# Patient Record
Sex: Female | Born: 1955 | Race: White | Hispanic: No | Marital: Married | Smoking: Never smoker
Health system: Southern US, Community
[De-identification: ages and names within clinical notes are randomized; demographics above are authoritative.]

## PROBLEM LIST (undated history)

## (undated) HISTORY — PX: EYE SURGERY: SHX253

## (undated) HISTORY — PX: CATARACT EXTRACTION: SUR2

---

## 2016-02-19 ENCOUNTER — Emergency Department (INDEPENDENT_AMBULATORY_CARE_PROVIDER_SITE_OTHER): Payer: BLUE CROSS/BLUE SHIELD

## 2016-02-19 ENCOUNTER — Emergency Department
Admission: EM | Admit: 2016-02-19 | Discharge: 2016-02-19 | Disposition: A | Discharge status: Home / Self Care | Payer: BLUE CROSS/BLUE SHIELD | Source: Home / Self Care | Attending: Family Medicine | Admitting: Family Medicine

## 2016-02-19 ENCOUNTER — Encounter: Payer: Self-pay | Admitting: *Deleted

## 2016-02-19 DIAGNOSIS — M7672 Peroneal tendinitis, left leg: Secondary | ICD-10-CM

## 2016-02-19 DIAGNOSIS — M25472 Effusion, left ankle: Secondary | ICD-10-CM

## 2016-02-19 MED ORDER — MELOXICAM 15 MG PO TABS: 15.0000 mg | ORAL_TABLET | Freq: Every day | ORAL | Status: DC

## 2016-02-19 NOTE — Discharge Instructions (Signed)
Apply ice pack for 20 to 30 minutes, 3 to 4 times daily for 2 to 3 days.  Elevate.  Wear brace for about 2 to 3 weeks.  Begin range of motion and stretching exercises in about 5 days as per instruction sheet.   Peroneal Tendinitis With Rehab Tendonitis is inflammation of a tendon. Inflammation of the tendons on the back of the outer ankle (peroneal tendons) is known as peroneal tendonitis. The peroneal tendons are responsible for connecting the muscles that allow you to stand on your tiptoes to the bones of the ankle. For this reason, peroneal tendonitis often causes pain when trying to complete such motions. Peroneal tendonitis often involves a tear (strain) of the peroneal tendons. Strains are classified into three categories. Grade 1 strains cause pain, but the tendon is not lengthened. Grade 2 strains include a lengthened ligament, due to the ligament being stretched or partially ruptured. With grade 2 strains there is still function, although function may be decreased. Grade 3 strains involve a complete tear of the tendon or muscle, and function is usually impaired. SYMPTOMS   Pain, tenderness, swelling, warmth, or redness over the back of the outer side of the ankle, the outer part of the mid-foot, or the bottom of the arch.  Pain that gets worse with ankle motion (especially when pushing off or pushing down with the front of the foot), or when standing on the ball of the foot or pushing the foot outward.  Crackling sound (crepitation) when the tendon is moved or touched. CAUSES  Peroneal tendinitis occurs when injury to the peroneal tendons causes the body to respond with inflammation. Common causes of injury include:  An overuse injury, in which the groove behind the outer ankle (where the tendon is located) causes wear on the tendon.  A sudden stress placed on the tendon, such as from an increase in the intensity, frequency, or duration of training.  Direct hit (trauma) to the  tendon.  Return to activity too soon after a previous ankle injury. RISK INCREASES WITH:  Sports that require sudden, repetitive pushing off of the foot, such as jumping or quick starts.  Kicking and running sports, especially running down hills or long distances.  Poor strength and flexibility.  Previous injury to the foot, ankle, or leg. PREVENTION  Warm up and stretch properly before activity.  Allow for adequate recovery between workouts.  Maintain physical fitness:  Strength, flexibility, and endurance.  Cardiovascular fitness.  Complete rehabilitation after previous injury. PROGNOSIS  If treated properly, peroneal tendonitis usually heals within 6 weeks.  RELATED COMPLICATIONS  Longer healing time, if not properly treated or if not given enough time to heal.  Recurring symptoms if activity is resumed too soon, with overuse, or when using poor technique.  If untreated, tendinitis may result in tendon rupture, requiring surgery. TREATMENT  Treatment first involves the use of ice and medicine to reduce pain and inflammation. The use of strengthening and stretching exercises may help reduce pain with activity. These exercises may be performed at unsuccessful, surgery to remove the inflamed tendon lining (sheath) may be advised.  MEDICATION   If pain medicine is needed, nonsteroidal anti-inflammatory medicines (aspirin and ibuprofen), or other minor pain relievers (acetaminophen), are often advised.  Do not take pain medicine for 7 days before surgery.  Prescription pain relievers may be given, if your caregiver thinks they are needed. Use only as directed and only as much as you need. HEAT AND COLD  Cold treatment (icing)  should be applied for 10 to 15 minutes every 2 to 3 hours for inflammation and pain, and immediately after activity that aggravates your symptoms. Use ice packs or an ice massage.  Heat treatment may be used before performing stretching and  strengthening activities prescribed by your caregiver, physical therapist, or athletic trainer. Use a heat pack or a warm water soak. SEEK MEDICAL CARE IF:  Symptoms get worse or do not improve in 2 to 4 weeks, despite treatment.  New, unexplained symptoms develop. (Drugs used in treatment may produce side effects.) EXERCISES RANGE OF MOTION (ROM) AND STRETCHING EXERCISES - Peroneal Tendinitis These exercises may help you when beginning to rehabilitate your injury. Your symptoms may resolve with or without further involvement from your physician, physical therapist or athletic trainer. While completing these exercises, remember:   Restoring tissue flexibility helps normal motion to return to the joints. This allows healthier, less painful movement and activity.  An effective stretch should be held for at least 30 seconds.  A stretch should never be painful. You should only feel a gentle lengthening or release in the stretched tissue. RANGE OF MOTION - Ankle Eversion  Sit with your right / left ankle crossed over your opposite knee.  Grip your foot with your opposite hand, placing your thumb on the top of your foot and your fingers across the bottom of your foot.  Gently push your foot downward with a slight rotation, so your littlest toes rise slightly toward the ceiling.  You should feel a gentle stretch on the inside of your ankle. Hold the stretch for __________ seconds. Repeat __________ times. Complete this exercise __________ times per day.  RANGE OF MOTION - Ankle Inversion  Sit with your right / left ankle crossed over your opposite knee.  Grip your foot with your opposite hand, placing your thumb on the bottom of your foot and your fingers across the top of your foot.  Gently pull your foot so the smallest toe comes toward you and your thumb pushes the inside of the ball of your foot away from you.  You should feel a gentle stretch on the outside of your ankle. Hold the  stretch for __________ seconds. Repeat __________ times. Complete this exercise __________ times per day.  RANGE OF MOTION - Ankle Plantar Flexion  Sit with your right / left leg crossed over your opposite knee.  Use your opposite hand to pull the top of your foot and toes toward you.  You should feel a gentle stretch on the top of your foot and ankle. Hold this position for __________ seconds. Repeat __________ times. Complete __________ times per day.  STRETCH - Gastroc, Standing  Place your hands on a wall.  Extend your right / left leg behind you, keeping the front knee somewhat bent.  Slightly point your toes inward on your back foot.  Keeping your right / left heel on the floor and your knee straight, shift your weight toward the wall, not allowing your back to arch.  You should feel a gentle stretch in the calf. Hold this position for __________ seconds. Repeat __________ times. Complete this stretch __________ times per day. STRETCH - Soleus, Standing  Place your hands on a wall.  Extend your right / left leg behind you, keeping the other knee somewhat bent.  Slightly point your toes inward on your back foot.  Keep your heel on the floor, bend your back knee, and slightly shift your weight over the back leg so  that you feel a gentle stretch deep in your back calf.  Hold this position for __________ seconds. Repeat __________ times. Complete this stretch __________ times per day. STRETCH - Gastrocsoleus, Standing Note: This exercise can place a lot of stress on your foot and ankle. Please complete this exercise only if specifically instructed by your caregiver.   Place the ball of your right / left foot on a step, keeping your other foot firmly on the same step.  Hold on to the wall or a rail for balance.  Slowly lift your other foot, allowing your body weight to press your heel down over the edge of the step.  You should feel a stretch in your right / left  calf.  Hold this position for __________ seconds.  Repeat this exercise with a slight bend in your knee. Repeat __________ times. Complete this stretch __________ times per day.  STRENGTHENING EXERCISES - Peroneal Tendinitis  These exercises may help you when beginning to rehabilitate your injury. They may resolve your symptoms with or without further involvement from your physician, physical therapist or athletic trainer. While completing these exercises, remember:   Muscles can gain both the endurance and the strength needed for everyday activities through controlled exercises.  Complete these exercises as instructed by your physician, physical therapist or athletic trainer. Increase the resistance and repetitions only as guided by your caregiver. STRENGTH - Dorsiflexors  Secure a rubber exercise band or tubing to a fixed object (table, pole) and loop the other end around your right / left foot.  Sit on the floor facing the fixed object. The band should be slightly tense when your foot is relaxed.  Slowly draw your foot back toward you, using your ankle and toes.  Hold this position for __________ seconds. Slowly release the tension in the band and return your foot to the starting position. Repeat __________ times. Complete this exercise __________ times per day.  STRENGTH - Towel Curls  Sit in a chair, on a non-carpeted surface.  Place your foot on a towel, keeping your heel on the floor.  Pull the towel toward your heel only by curling your toes. Keep your heel on the floor.  If instructed by your physician, physical therapist or athletic trainer, add weight to the end of the towel. Repeat __________ times. Complete this exercise __________ times per day. STRENGTH - Ankle Eversion   Secure one end of a rubber exercise band or tubing to a fixed object (table, pole). Loop the other end around your foot, just before your toes.  Place your fists between your knees. This will focus  your strengthening at your ankle.  Drawing the band across your opposite foot, away from the pole, slowly, pull your little toe out and up. Make sure the band is positioned to resist the entire motion.  Hold this position for __________ seconds.  Have your muscles resist the band, as it slowly pulls your foot back to the starting position. Repeat __________ times. Complete this exercise __________ times per day.    This information is not intended to replace advice given to you by your health care provider. Make sure you discuss any questions you have with your health care provider.   Document Released: 08/18/2005 Document Revised: 01/02/2015 Document Reviewed: 11/30/2008 Elsevier Interactive Patient Education [employer]

## 2016-02-19 NOTE — ED Provider Notes (Signed)
CSN: [id number]     Arrival date & time 02/19/16  1737 History   First MD Initiated Contact with Patient 02/19/16 1809     Chief Complaint  Patient presents with  . Ankle Pain      HPI Comments: Patient has a history of left knee pain and has been undergoing PT twice weekly since February 2017.  During the past two weeks she has developed pain in her left ankle with persistent swelling.  During this time she has received some therapy on her left ankle without improvement.  Patient is a 60 y.o. female presenting with ankle pain. The history is provided by the patient.  Ankle Pain Location:  Ankle Time since incident:  2 weeks Injury: no   Ankle location:  L ankle Pain details:    Quality:  Aching   Radiates to:  Does not radiate   Severity:  Mild   Onset quality:  Gradual   Duration:  2 weeks   Progression:  Unchanged Chronicity:  New Dislocation: no   Prior injury to area:  No Relieved by:  Nothing Worsened by:  Activity and bearing weight Ineffective treatments:  Ice (physical therapy) Associated symptoms: stiffness and swelling   Associated symptoms: no decreased ROM, no muscle weakness, no numbness and no tingling     History reviewed. No pertinent past medical history. Past Surgical History  Procedure Laterality Date  . Eye surgery     Family History  Problem Relation Age of Onset  . Heart disease Mother    Social History  Substance Use Topics  . Smoking status: Never Smoker   . Smokeless tobacco: None  . Alcohol Use: Yes     Comment: 5 q wk   OB History    No data available     Review of Systems  Musculoskeletal: Positive for stiffness.  All other systems reviewed and are negative.   Allergies  Review of patient's allergies indicates no known allergies.  Home Medications   Prior to Admission medications   Medication Sig Start Date End Date Taking? Authorizing Provider  meloxicam (MOBIC) 15 MG tablet Take 1 tablet (15 mg total) by mouth daily. Take  with food each morning 02/19/16   Lattie Haw, MD   Meds Ordered and Administered this Visit  Medications - No data to display  BP 131/84 mmHg  Pulse 51  Temp(Src) 98.1 F (36.7 C) (Oral)  Resp 16  Ht  (1.727 m)  Wt 147 lb (66.679 kg)  BMI 22.36 kg/m2  SpO2 100% No data found.   Physical Exam  Constitutional: She is oriented to person, place, and time. She appears well-developed and well-nourished. No distress.  HENT:  Head: Normocephalic.  Eyes: Pupils are equal, round, and reactive to light.  Musculoskeletal: She exhibits no edema.       Left ankle: She exhibits swelling. She exhibits normal range of motion, no ecchymosis, no deformity and normal pulse. Tenderness.       Feet:  There is tenderness over the course of the left peroneal tendon and base of 5th metatarsal.  Pain is elicited with resisted eversion and resisted plantar flexion of the ankle.  Distal neurovascular function is intact.    Note that the left leg is about 1.5cm longer than the right by inspection.  Neurological: She is alert and oriented to person, place, and time.  Skin: Skin is warm and dry. No rash noted.  Nursing note and vitals reviewed.   ED Course  Procedures none    Imaging Review Dg Ankle Complete Left  02/19/2016  CLINICAL DATA:  Lateral left ankle swelling and pain for 2 weeks. No known injury. EXAM: LEFT ANKLE COMPLETE - 3+ VIEW COMPARISON:  None. FINDINGS: Lateral soft tissue swelling is noted as well as probable ankle joint effusion. Several well corticated ossific densities are seen along the inferior aspect of the lateral malleolus, which likely represent old avulsion injuries or accessory ossicles. No acute fracture identified. No evidence of dislocation. No other significant bone abnormality identified. IMPRESSION: Lateral soft tissue swelling and probable ankle joint effusion. No acute osseous abnormality identified. Electronically Signed   By: Myles RosenthalJohn  Stahl M.D.   On: 02/19/2016  18:27      MDM   1. Peroneal tendonitis, left    Dispensed AirCast stirrup splint.  Begin Mobic 15mg  daily. Apply ice pack for 20 to 30 minutes, 3 to 4 times daily for 2 to 3 days.  Elevate.  Wear brace for about 2 to 3 weeks.  Begin range of motion and stretching exercises in about 5 days as per instruction sheet. Followup with Dr. Rodney Langtonhomas Thekkekandam or Dr. Clementeen GrahamEvan Corey ([facility]) for management.    Lattie HawStephen A Asuna Peth, MD 02/26/16 [phone]

## 2016-02-19 NOTE — ED Notes (Signed)
Pt c/o LT ankle swelling x 2 wks. Denies injury. She reports seeing PT for a LT knee issue and feels as though the knee pain may be effecting her gait causing her ankle to swell. She has seen PT for her ankle for 2 wks with minimal relief.

## 2016-03-11 ENCOUNTER — Emergency Department
Admission: EM | Admit: 2016-03-11 | Discharge: 2016-03-11 | Disposition: A | Discharge status: Home / Self Care | Payer: BLUE CROSS/BLUE SHIELD | Source: Home / Self Care | Attending: Family Medicine | Admitting: Family Medicine

## 2016-03-11 ENCOUNTER — Encounter: Payer: Self-pay | Admitting: *Deleted

## 2016-03-11 DIAGNOSIS — R21 Rash and other nonspecific skin eruption: Secondary | ICD-10-CM

## 2016-03-11 DIAGNOSIS — T148 Other injury of unspecified body region: Secondary | ICD-10-CM

## 2016-03-11 DIAGNOSIS — W57XXXA Bitten or stung by nonvenomous insect and other nonvenomous arthropods, initial encounter: Secondary | ICD-10-CM

## 2016-03-11 MED ORDER — DOXYCYCLINE HYCLATE 100 MG PO CAPS
100.0000 mg | ORAL_CAPSULE | Freq: Two times a day (BID) | ORAL | Status: DC
Start: 2016-03-11 — End: 2016-09-07

## 2016-03-11 NOTE — ED Provider Notes (Signed)
CSN: [id number]     Arrival date & time 03/11/16  [id number] History   First MD Initiated Contact with Patient 03/11/16 [phone]     Chief Complaint  Patient presents with  . Insect Bite   (Consider location/radiation/quality/duration/timing/severity/associated sxs/prior Treatment) HPI  Vicki Snyder is a 60 y.o. female presenting to UC with concern for possible insect bite on her Right upper calf, noticed about 2-3 days ago. Mild itching and redness at the site. Denies pain, fever, chills, n/v/d, or arthralgias.  Pt concerned as her sister was recently bit by a tick, did not have a rash, and still contracted Lyme's disease. She has not been using any medications at home on the possible bite.    History reviewed. No pertinent past medical history. Past Surgical History  Procedure Laterality Date  . Eye surgery     Family History  Problem Relation Age of Onset  . Heart disease Mother    Social History  Substance Use Topics  . Smoking status: Never Smoker   . Smokeless tobacco: None  . Alcohol Use: Yes     Comment: 5 q wk   OB History    No data available     Review of Systems  Constitutional: Negative for fever and chills.  Respiratory: Negative for shortness of breath and wheezing.   Gastrointestinal: Negative for nausea and vomiting.  Musculoskeletal: Negative for myalgias, joint swelling and arthralgias.  Skin: Positive for color change and rash. Negative for wound.    Allergies  Review of patient's allergies indicates no known allergies.  Home Medications   Prior to Admission medications   Medication Sig Start Date End Date Taking? Authorizing Provider  doxycycline (VIBRAMYCIN) 100 MG capsule Take 1 capsule (100 mg total) by mouth 2 (two) times daily. One po bid x 10 days 03/11/16   Junius FinnerErin O'Malley, PA-C   Meds Ordered and Administered this Visit  Medications - No data to display  BP 121/71 mmHg  Pulse 56  Temp(Src) 98.3 F (36.8 C) (Oral)  Wt 143 lb (64.864 kg)  SpO2  100% No data found.   Physical Exam  Constitutional: She is oriented to person, place, and time. She appears well-developed and well-nourished.  HENT:  Head: Normocephalic and atraumatic.  Eyes: EOM are normal.  Neck: Normal range of motion.  Cardiovascular: Normal rate.   Pulmonary/Chest: Effort normal.  Musculoskeletal: Normal range of motion.  Neurological: She is alert and oriented to person, place, and time.  Skin: Skin is warm and dry. Rash noted. There is erythema.  Right upper calf: 3-594mm erythematous papule with surrounding outer ring c/w small "target" lesion. No foreign bodies noted on exam. No bleeding or discharge. Lesion does blanch. Non-tender.  Psychiatric: She has a normal mood and affect. Her behavior is normal.  Nursing note and vitals reviewed.   ED Course  Procedures (including critical care time)  Labs Review Labs Reviewed - No data to display  Imaging Review No results found.    MDM   1. Insect bite   2. Rash and nonspecific skin eruption    Rash vs possible insect bite on Right upper calf.  Will cover for tick disease, although, advised pt it could be local reaction to another insect bite.  Pt declined testing as she will be treated empirically.  Rx: Doxycycline BID for 10 days. F/u with PCP as needed. Patient verbalized understanding and agreement with treatment plan.     Junius FinnerErin O'Malley, PA-C 03/11/16 [phone]

## 2016-03-11 NOTE — ED Notes (Signed)
Pt c/o possible insect bite to her right upper calf about 2-3 days ago. Other than minimal itching and redness at the site, she is asymptomatic.

## 2016-03-11 NOTE — Discharge Instructions (Signed)
°  Please take antibiotics as prescribed and be sure to complete entire course even if you start to feel better to ensure infection does not come back. ° °

## 2016-09-07 ENCOUNTER — Emergency Department
Admission: EM | Admit: 2016-09-07 | Discharge: 2016-09-07 | Disposition: A | Discharge status: Home / Self Care | Payer: BLUE CROSS/BLUE SHIELD | Source: Home / Self Care | Attending: Family Medicine | Admitting: Family Medicine

## 2016-09-07 DIAGNOSIS — R220 Localized swelling, mass and lump, head: Secondary | ICD-10-CM | POA: Diagnosis not present

## 2016-09-07 MED ORDER — PREDNISONE 20 MG PO TABS: 20.0000 mg | ORAL_TABLET | Freq: Two times a day (BID) | ORAL | 0 refills | Status: AC

## 2016-09-07 MED ORDER — AMOXICILLIN 875 MG PO TABS: 875.0000 mg | ORAL_TABLET | Freq: Two times a day (BID) | ORAL | 0 refills | Status: AC

## 2016-09-07 NOTE — ED Triage Notes (Signed)
Vicki Snyder complains of facial swelling for 2 days. She has taken Claritin and Sudafed without relief. Denies fever, chills, sweats, cough, sore throat or ear pain.

## 2016-09-07 NOTE — Discharge Instructions (Signed)
Continue Claritin May add Pseudoephedrine (30mg , one or two every 4 to 6 hours) if sinus congestion increases.  Begin Amoxicillin if not improving about one week or if persistent fever develops (Given a prescription to hold, with an expiration date)  Avoid using [facility].

## 2016-09-07 NOTE — ED Provider Notes (Signed)
[facility]    CSN: [id number] Arrival date & time: 09/07/16  1106     History   Chief Complaint Chief Complaint  Patient presents with  . Facial Swelling    x 2 days    HPI Vicki Snyder is a 61 y.o. female.   Patient awoke two days ago with mild swelling in both cheeks which improved later in the day.  She is concerned that she may have a sinus infection, but denies any sinus drainage or rhinorhea.  No sore throat or URI symptoms.  No fevers, chills, and sweats.  She recalls that she had been outside in cold weather four days prior, and then had applied a new preparation of Clinique cream to her face.  Her symptoms have improved slightly with Claritin.   The history is provided by the patient.    History reviewed. No pertinent past medical history.  There are no active problems to display for this patient.   Past Surgical History:  Procedure Laterality Date  . CATARACT EXTRACTION Right   . EYE SURGERY      OB History    No data available       Home Medications    Prior to Admission medications   Medication Sig Start Date End Date Taking? Authorizing Provider  loratadine (CLARITIN) 10 MG tablet Take 10 mg by mouth daily.   Yes Historical Provider, MD  pseudoephedrine (SUDAFED) 60 MG tablet Take 60 mg by mouth every 4 (four) hours as needed for congestion.   Yes Historical Provider, MD  amoxicillin (AMOXIL) 875 MG tablet Take 1 tablet (875 mg total) by mouth 2 (two) times daily. (Rx void after 09/15/16) 09/07/16   Lattie HawStephen A Beese, MD  predniSONE (DELTASONE) 20 MG tablet Take 1 tablet (20 mg total) by mouth 2 (two) times daily. Take with food. 09/07/16   Lattie HawStephen A Beese, MD    Family History Family History  Problem Relation Age of Onset  . Heart disease Mother     Social History Social History  Substance Use Topics  . Smoking status: Never Smoker  . Smokeless tobacco: Never Used  . Alcohol use 6.0 oz/week    10 Glasses of wine per week   Comment: 5 q wk     Allergies   Patient has no known allergies.   Review of Systems Review of Systems No sore throat No cough No pleuritic pain No wheezing No nasal congestion No post-nasal drainage No sinus pain/pressure No itchy/red eyes No earache No hemoptysis No SOB No fever/chills No nausea No vomiting No abdominal pain No diarrhea No urinary symptoms No skin rash, but complains of slight redness and swelling in cheeks No fatigue No myalgias No headache Used OTC meds without relief   Physical Exam Triage Vital Signs ED Triage Vitals  Enc Vitals Group     BP 09/07/16 1126 114/75     Pulse Rate 09/07/16 1126 60     Resp 09/07/16 1126 17     Temp 09/07/16 1126 97.4 F (36.3 C)     Temp Source 09/07/16 1126 Oral     SpO2 09/07/16 1126 99 %     Weight 09/07/16 1127 146 lb (66.2 kg)     Height 09/07/16 1127 5' 8.5" (1.74 m)     Head Circumference --      Peak Flow --      Pain Score 09/07/16 1130 0     Pain Loc --      Pain  Edu? --      Excl. in GC? --    No data found.   Updated Vital Signs BP 114/75 (BP Location: Left Arm)   Pulse 60   Temp 97.4 F (36.3 C) (Oral)   Resp 17   Ht 5' 8.5" (1.74 m)   Wt 146 lb (66.2 kg)   SpO2 99%   BMI 21.88 kg/m   Visual Acuity Right Eye Distance:   Left Eye Distance:   Bilateral Distance:    Right Eye Near:   Left Eye Near:    Bilateral Near:     Physical Exam  Constitutional: She appears well-developed and well-nourished.  HENT:  Head: Normocephalic.    Right Ear: Tympanic membrane, external ear and ear canal normal.  Left Ear: Tympanic membrane, external ear and ear canal normal.  Nose: Nose normal. No mucosal edema, rhinorrhea or sinus tenderness.  Mouth/Throat: Oropharynx is clear and moist.  Cheeks are slightly erythematous bilaterally, but no tenderness to palpation.  No erythema or warmth.  Eyes: Conjunctivae and EOM are normal. Pupils are equal, round, and reactive to light.  Neck:  Neck supple.  Cardiovascular: Normal heart sounds.   Pulmonary/Chest: Breath sounds normal.  Lymphadenopathy:    She has no cervical adenopathy.  Nursing note and vitals reviewed.    UC Treatments / Results  Labs (all labs ordered are listed, but only abnormal results are displayed) Labs Reviewed - No data to display  EKG  EKG Interpretation None       Radiology No results found.  Procedures Procedures (including critical care time)  Medications Ordered in UC Medications - No data to display   Initial Impression / Assessment and Plan / UC Course  I have reviewed the triage vital signs and the nursing notes.  Pertinent labs & imaging results that were available during my care of the patient were reviewed by me and considered in my medical decision making (see chart for details).  Clinical Course   Sinusitis appears unlikely.  Patient's facial swelling probably a mild contact dermatitis caused by a new preparation of Clinique. Begin brief prednisone burst. Continue Claritin May add Pseudoephedrine (30mg , one or two every 4 to 6 hours) if sinus congestion increases.  Begin Amoxicillin if not improving about one week or if persistent fever develops (Given a prescription to hold, with an expiration date)  Avoid using Clinique. Followup with Family Doctor if not improved in one week.      Final Clinical Impressions(s) / UC Diagnoses   Final diagnoses:  Facial swelling    New Prescriptions New Prescriptions   AMOXICILLIN (AMOXIL) 875 MG TABLET    Take 1 tablet (875 mg total) by mouth 2 (two) times daily. (Rx void after 09/15/16)   PREDNISONE (DELTASONE) 20 MG TABLET    Take 1 tablet (20 mg total) by mouth 2 (two) times daily. Take with food.     Lattie Haw, MD 09/07/16 1159

## 2017-03-04 IMAGING — DX DG ANKLE COMPLETE 3+V*L*
3 series · 3 of 3 positions shown · non-contrast
Comparison: None.

CLINICAL DATA: Lateral left ankle swelling and pain for 2 weeks. No
known injury.

EXAM:
LEFT ANKLE COMPLETE - 3+ VIEW

[ankle ap]
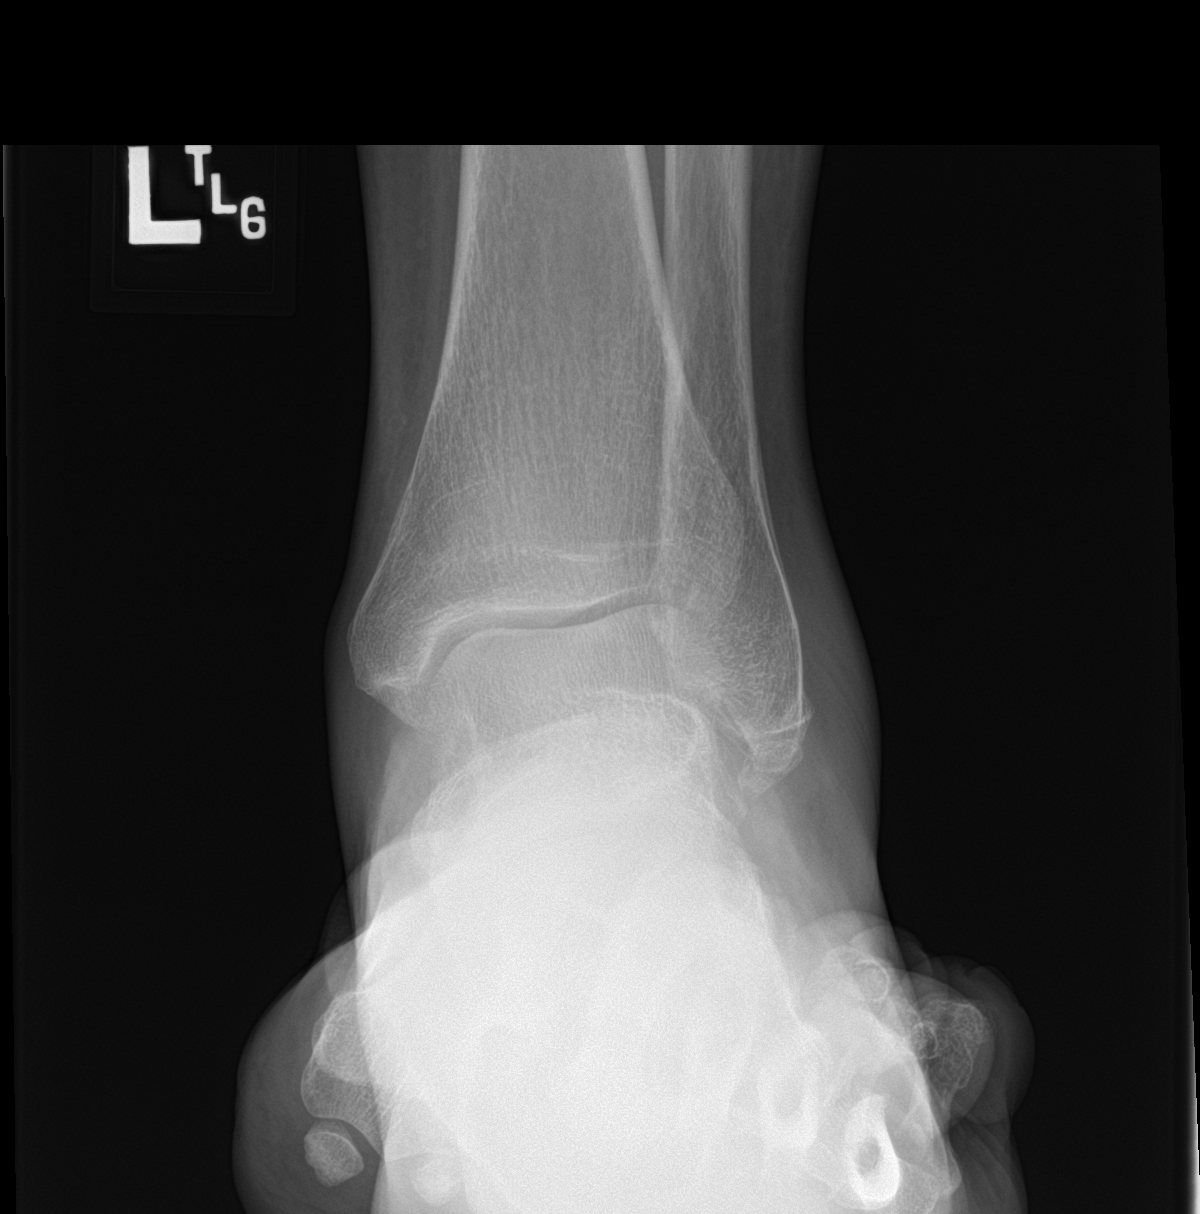

[ankle obl]
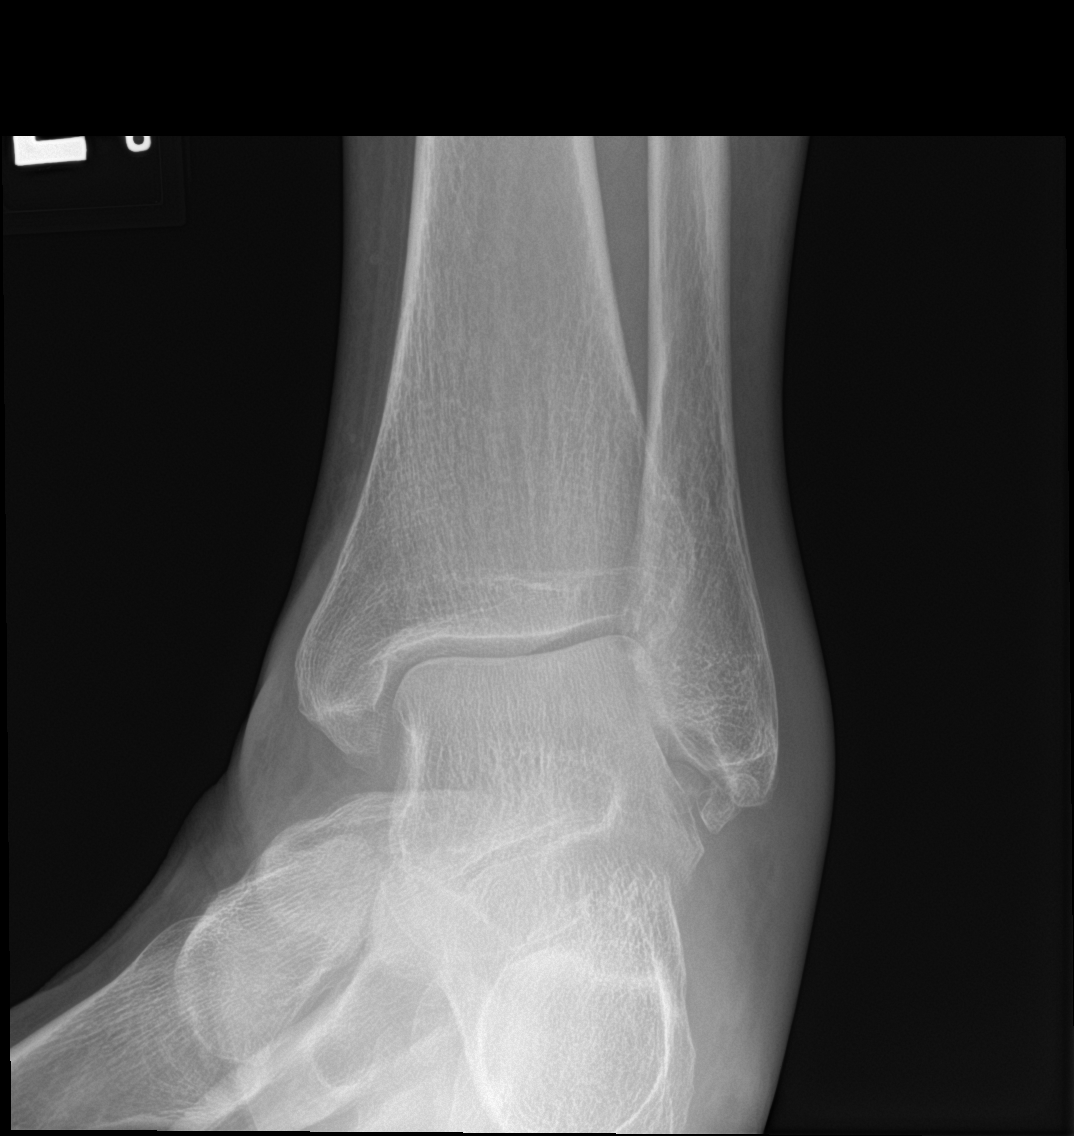

[ankle lat]
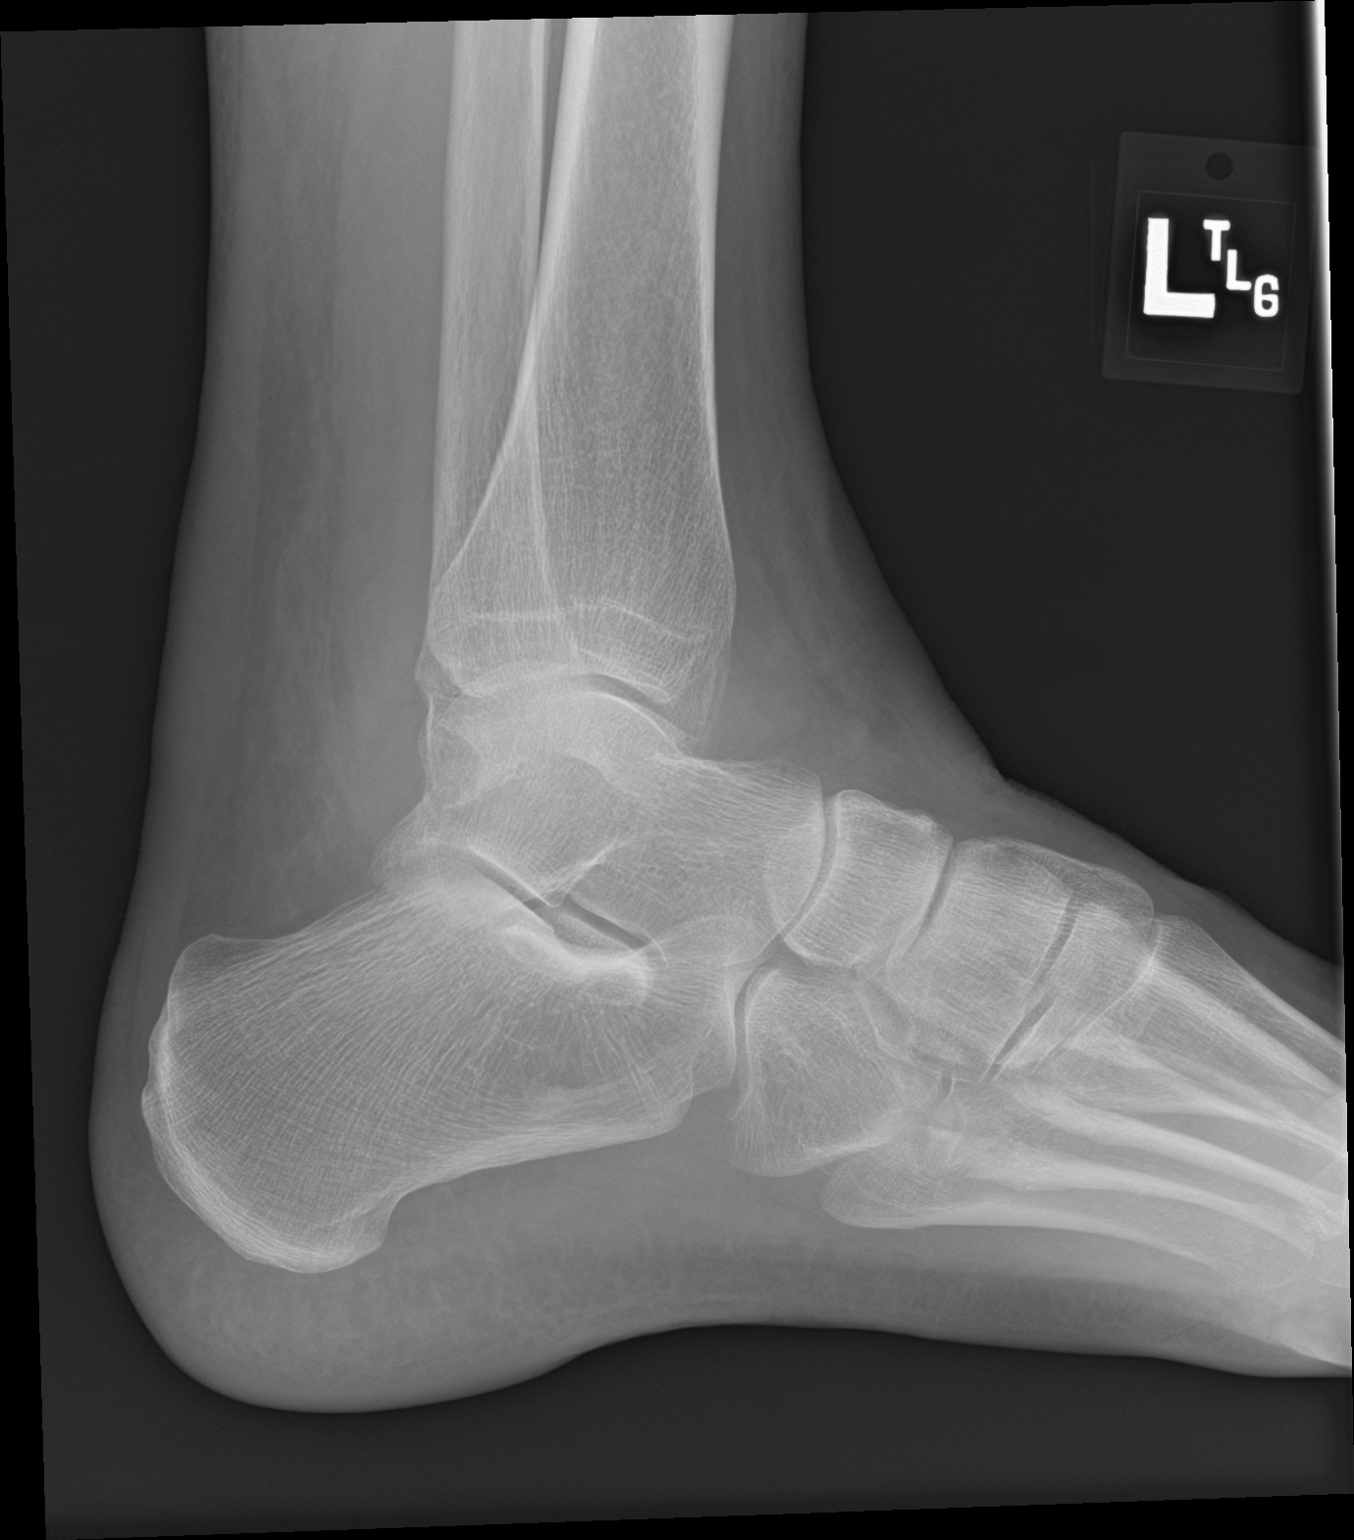

[3 of 3 positions shown; findings below may reference images not displayed]

FINDINGS: Lateral soft tissue swelling is noted as well as probable ankle
joint effusion.

Several well corticated ossific densities are seen along the
inferior aspect of the lateral malleolus, which likely represent old
avulsion injuries or accessory ossicles. No acute fracture
identified. No evidence of dislocation. No other significant bone
abnormality identified.
IMPRESSION: Lateral soft tissue swelling and probable ankle joint effusion. No
acute osseous abnormality identified.
